# Patient Record
Sex: Male | Born: 1995 | Race: Black or African American | Hispanic: No | Marital: Single
Health system: Southern US, Community
[De-identification: ages and names within clinical notes are randomized; demographics above are authoritative.]

---
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
## 2020-01-11 ENCOUNTER — Ambulatory Visit: Payer: Self-pay | Attending: Internal Medicine

## 2020-01-11 DIAGNOSIS — Z23 Encounter for immunization: Secondary | ICD-10-CM

## 2020-01-11 NOTE — Progress Notes (Signed)
   [facility]  Name:  DAVIYON WIDMAYER    MRN: [id number] DOB: 02-27-1996  01/11/2020  Mr. Hannis was observed post Covid-19 immunization for 15 minutes without incident. He was provided with Vaccine Information Sheet and instruction to access the V-Safe system.   Mr. Sternberg was instructed to call 911 with any severe reactions post vaccine: Marland Kitchen Difficulty breathing  . Swelling of face and throat  . A fast heartbeat  . A bad rash all over body  . Dizziness and weakness   Immunizations Administered    Name Date Dose VIS Date Route   Moderna COVID-19 Vaccine 01/11/2020  1:47 PM 0.5 mL 10/04/2019 Intramuscular   Manufacturer: Moderna   Lot: [id number]   NDC: 89340-684-03

## 2021-02-19 ENCOUNTER — Other Ambulatory Visit (HOSPITAL_COMMUNITY): Payer: Self-pay
